# Patient Record
Sex: Female | Born: 2000 | Race: Black or African American | Hispanic: No | Marital: Single | Smoking: Never smoker
Health system: Southern US, Community
[De-identification: ages and names within clinical notes are randomized; demographics above are authoritative.]

## PROBLEM LIST (undated history)

## (undated) DIAGNOSIS — N83209 Unspecified ovarian cyst, unspecified side: Secondary | ICD-10-CM

## (undated) HISTORY — PX: CYSTECTOMY: SHX5119

## (undated) HISTORY — DX: Unspecified ovarian cyst, unspecified side: N83.209

## (undated) HISTORY — PX: NO PAST SURGERIES: SHX2092

---
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
## 2022-05-26 IMAGING — CR DG CHEST 2V
2 series · 2 of 2 positions shown · non-contrast
Comparison: None.

CLINICAL DATA: Cough and short of breath

EXAM:
CHEST - 2 VIEW

[w chest pa]
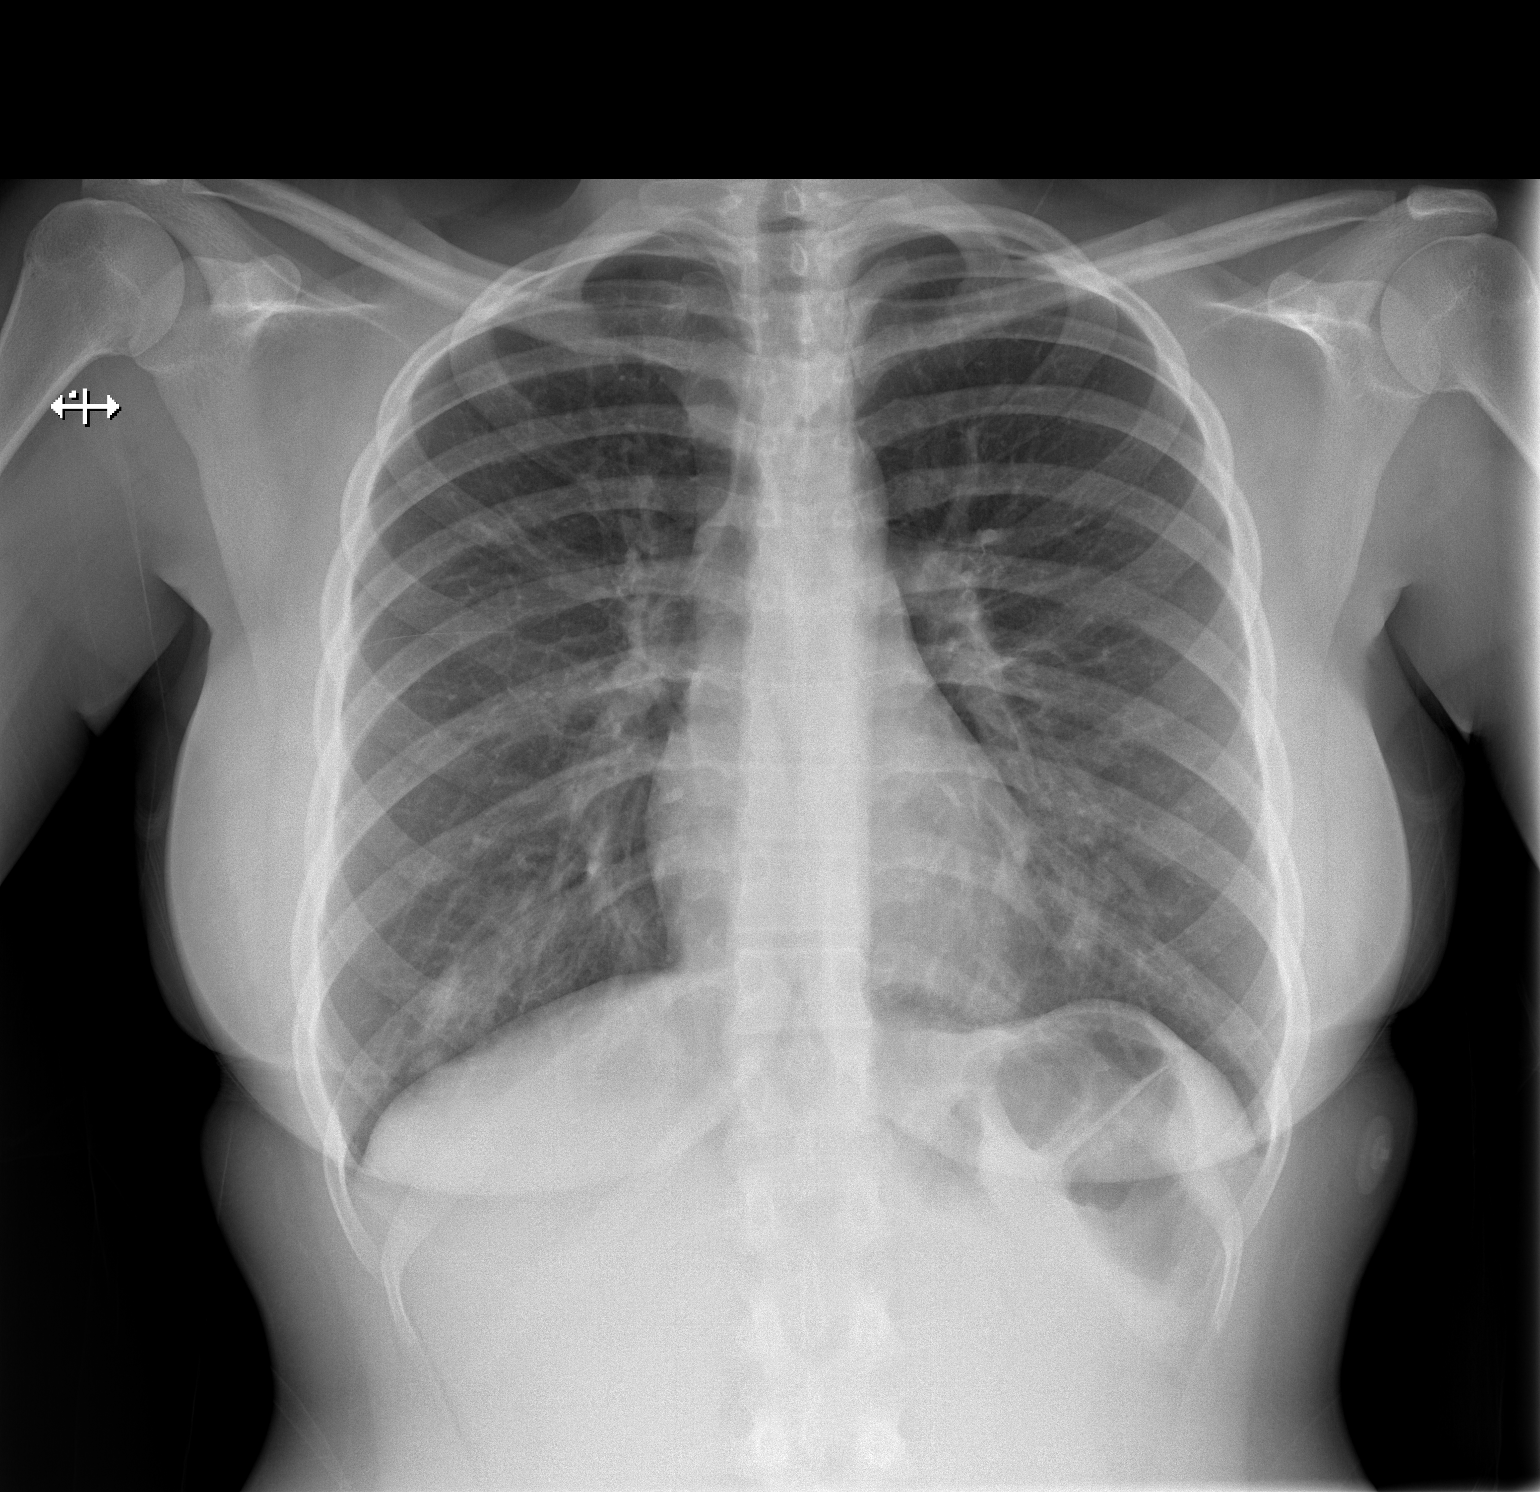

[w chest lat]
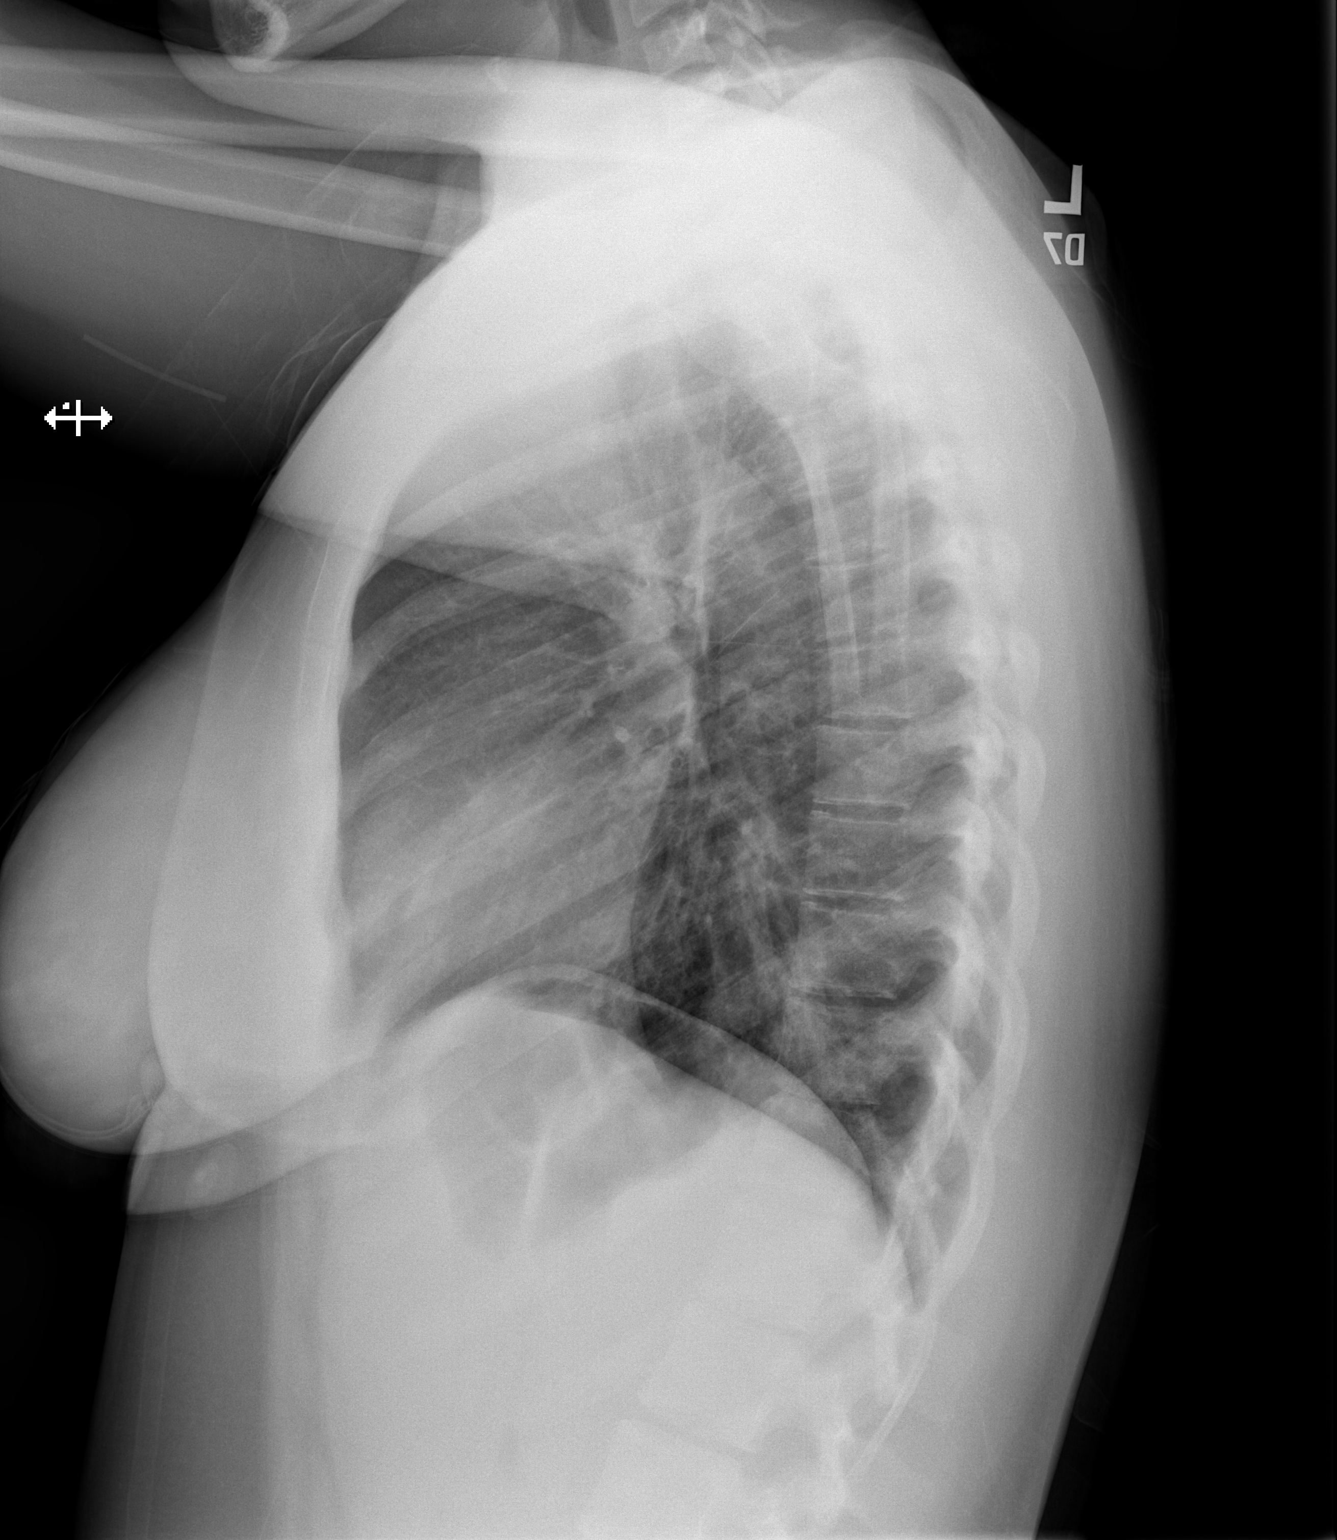

[2 of 2 positions shown; findings below may reference images not displayed]

FINDINGS: Patchy right base opacity. No pleural effusion. Normal heart size.
No pneumothorax
IMPRESSION: Patchy right base opacity suspicious for pneumonia.

## 2023-04-04 ENCOUNTER — Ambulatory Visit: Payer: Medicaid Other | Admitting: Obstetrics and Gynecology

## 2023-04-04 ENCOUNTER — Encounter: Payer: Self-pay | Admitting: Obstetrics and Gynecology

## 2023-04-04 VITALS — BP 95/62 | HR 75 | Ht 64.0 in | Wt 140.0 lb

## 2023-04-04 DIAGNOSIS — Z90721 Acquired absence of ovaries, unilateral: Secondary | ICD-10-CM

## 2023-04-04 NOTE — Progress Notes (Signed)
 Pt states she has cyst on right ovary.   Pt states she had surgery last summer - some info under media tab - TAP[facility] notes. Pt was advised to find GYN for further evaluation. Pt states she will have some pain on the left side.  Pt has Nexplanon for [facility].

## 2023-04-04 NOTE — Progress Notes (Signed)
 NEW GYNECOLOGY PATIENT Patient name: Jamie Carter MRN [id number]  Date of birth: 12-16-2000 Chief Complaint:   New Patient (Initial Visit)     History:  Naveena Eyman is a 23 y.o. G0P0000 being seen today for establishment of care. Underwent surgery to remove ovary last year for large ovarian cyst. Was told the cyst is too big and covering the ovary therefore removed. Told there was a cyst on her othre ovary and that it wasn't safe to do it at that time. No ultrasound since then. Nexplanon in place was changed out recently. Not currently sexually active. No abnormal vaginal discharge.  No pain presently      Gynecologic History No LMP recorded. Contraception: Nexplanon Last Pap: not indicated Last Mammogram: n/a Last Colonoscopy: n/a  Obstetric History OB History  Gravida Para Term Preterm AB Living  0 0 0 0 0 0  SAB IAB Ectopic Multiple Live Births  0 0 0 0 0    Past Medical History:  Diagnosis Date   Ovarian cyst     Past Surgical History:  Procedure Laterality Date   CYSTECTOMY     NO PAST SURGERIES      Current Outpatient Medications on File Prior to Visit  Medication Sig Dispense Refill   etonogestrel (NEXPLANON) 68 MG IMPL implant 1 each by Subdermal route once.     ibuprofen (ADVIL,MOTRIN) 600 MG tablet Take 600 mg by mouth every 6 (six) hours as needed.     riboflavin (VITAMIN B-2) 100 MG TABS tablet Take 1 tablet (100 mg total) by mouth daily. (Patient not taking: Reported on 06/16/2018)  0   No current facility-administered medications on file prior to visit.    Allergies  Allergen Reactions   Grass Extracts [Gramineae Pollens]     Social History:  reports that she has never smoked. She has never used smokeless tobacco. She reports current alcohol use. She reports current drug use. Drug: Marijuana.  Family History  Problem Relation Age of Onset   Sickle cell trait Mother    Migraines Neg Hx    Seizures Neg Hx     The following portions of the  patient's history were reviewed and updated as appropriate: allergies, current medications, past family history, past medical history, past social history, past surgical history and problem list.  Review of Systems Pertinent items noted in HPI and remainder of comprehensive ROS otherwise negative.  Physical Exam:  BP 95/62   Pulse 75   Ht 5' 4 (1.626 m)   Wt 140 lb (63.5 kg)   BMI 24.03 kg/m  Physical Exam Vitals and nursing note reviewed. Exam conducted with a chaperone present.  Constitutional:      Appearance: Normal appearance.  Pulmonary:     Effort: Pulmonary effort is normal.  Abdominal:     Palpations: Abdomen is soft.     Comments: Well healed laparoscopic scar   Genitourinary:    General: Normal vulva.     Exam position: Lithotomy position.  Neurological:     Mental Status: She is alert.       Assessment and Plan:   1. History of oophorectomy, unilateral (Primary) Patient to complete ROI to obtain operative report. Pelvic US  to assess for cyst on remaining ovary. Otherwise, return in a year for annual with pap smear.  - US  PELVIC COMPLETE WITH TRANSVAGINAL; Future    Routine preventative health maintenance measures emphasized. Please refer to After Visit Summary for other counseling recommendations.   Follow-up: No follow-ups on  file.      Carter Quarry, MD Obstetrician & Gynecologist, Faculty Practice [facility] for [employer], [facility]

## 2023-04-12 ENCOUNTER — Ambulatory Visit (HOSPITAL_BASED_OUTPATIENT_CLINIC_OR_DEPARTMENT_OTHER)
Admission: RE | Admit: 2023-04-12 | Discharge: 2023-04-12 | Disposition: A | Discharge status: Home / Self Care | Payer: Medicaid Other | Source: Ambulatory Visit | Attending: Obstetrics and Gynecology | Admitting: Obstetrics and Gynecology

## 2023-04-12 DIAGNOSIS — Z90721 Acquired absence of ovaries, unilateral: Secondary | ICD-10-CM | POA: Diagnosis present

## 2023-04-15 ENCOUNTER — Encounter: Payer: Self-pay | Admitting: Obstetrics and Gynecology

## 2023-04-15 ENCOUNTER — Other Ambulatory Visit: Payer: Self-pay | Admitting: Obstetrics and Gynecology

## 2023-04-15 DIAGNOSIS — N83201 Unspecified ovarian cyst, right side: Secondary | ICD-10-CM

## 2023-05-15 ENCOUNTER — Encounter: Payer: Self-pay | Admitting: Obstetrics and Gynecology

## 2023-05-18 ENCOUNTER — Ambulatory Visit
Admission: RE | Admit: 2023-05-18 | Discharge: 2023-05-18 | Disposition: A | Discharge status: Home / Self Care | Payer: Medicaid Other | Source: Ambulatory Visit | Attending: Obstetrics and Gynecology

## 2023-05-18 DIAGNOSIS — N83201 Unspecified ovarian cyst, right side: Secondary | ICD-10-CM

## 2023-05-18 MED ORDER — GADOPICLENOL 0.5 MMOL/ML IV SOLN
6.0000 mL | Freq: Once | INTRAVENOUS | Status: AC | PRN
  Administered 2023-05-18: 6 mL via INTRAVENOUS

## 2023-05-22 ENCOUNTER — Encounter: Payer: Self-pay | Admitting: Obstetrics and Gynecology

## 2023-05-26 ENCOUNTER — Telehealth: Admitting: Nurse Practitioner

## 2023-05-26 DIAGNOSIS — B3731 Acute candidiasis of vulva and vagina: Secondary | ICD-10-CM

## 2023-05-26 MED ORDER — FLUCONAZOLE 150 MG PO TABS: 150.0000 mg | ORAL_TABLET | Freq: Every day | ORAL | 0 refills | Status: DC

## 2023-05-26 NOTE — Progress Notes (Signed)
 I have spent 5 minutes in review of e-visit questionnaire, review and updating patient chart, medical decision making and response to patient.   Claiborne Rigg, NP

## 2023-05-26 NOTE — Progress Notes (Signed)

## 2023-06-05 ENCOUNTER — Encounter: Payer: Self-pay | Admitting: Obstetrics and Gynecology

## 2023-06-05 ENCOUNTER — Ambulatory Visit: Admitting: Obstetrics and Gynecology

## 2023-06-05 VITALS — BP 106/64 | HR 62 | Ht 64.0 in | Wt 123.0 lb

## 2023-06-05 DIAGNOSIS — Z90721 Acquired absence of ovaries, unilateral: Secondary | ICD-10-CM

## 2023-06-05 DIAGNOSIS — N83201 Unspecified ovarian cyst, right side: Secondary | ICD-10-CM

## 2023-06-05 DIAGNOSIS — Z113 Encounter for screening for infections with a predominantly sexual mode of transmission: Secondary | ICD-10-CM

## 2023-06-05 NOTE — Progress Notes (Signed)
 Neplanon reinserted 12/2022. Asking for STI testing today. Only needs blood work. Has current therapist. Has white bump on vulva. Wants checked.

## 2023-06-05 NOTE — Progress Notes (Signed)
 GYNECOLOGY VISIT  Patient name: Jamie Carter MRN [id number]  Date of birth: 10/15/2000 Chief Complaint:   Follow-up  History:  Jamie Carter is a 23 y.o. G0P0000 being seen today for follow up regarding ovarian cyst; also requesting STI testing today .    New Gyn (04/04/23): hx of unilateral oophorectomy for enlargedo varian cyst, nexplanon in place for contraception. No pain. [place] ordered to assess remaining ovary as told previously that there was an ovary present.   Discussed the use of AI scribe software for clinical note transcription with the patient, who gave verbal consent to proceed.  History of Present Illness Jamie Carter is a 23 year old female with a history of ovarian cysts who presents with pain and concerns about a new ovarian cyst.  She has a history of ovarian cysts and previously underwent surgery to remove one ovary due to a large cyst. She now presents with a new large dermoid cyst on her remaining ovary, measuring nearly eight centimeters. She experiences sharp, stabbing pain on her side, which is a new symptom as she previously did not have pain.  She noticed a small white bump on her vulva after experiencing discomfort that she initially thought might be a yeast infection. She took a yeast infection pill through an online service, which alleviated her irritation. The bump does not cause pain, and she is concerned about the possibility of it being herpes.  She has requested serum STI tests, including HIV and syphilis, during this visit.   Past Medical History:  Diagnosis Date   Ovarian cyst     Past Surgical History:  Procedure Laterality Date   CYSTECTOMY     NO PAST SURGERIES      The following portions of the patient's history were reviewed and updated as appropriate: allergies, current medications, past family history, past medical history, past social history, past surgical history and problem list.   Health Maintenance:   Last pap n/a Last  mammogram: n/a   Review of Systems:  Pertinent items are noted in HPI. Comprehensive review of systems was otherwise negative.   Objective:  Physical Exam BP 106/64   Pulse 62   Ht 5\' 4"  (1.626 m)   Wt 123 lb (55.8 kg)   LMP 05/23/2023   BMI 21.11 kg/m    Physical Exam Vitals and nursing note reviewed. Exam conducted with a chaperone present.  Constitutional:      Appearance: Normal appearance.  HENT:     Head: Normocephalic and atraumatic.  Pulmonary:     Effort: Pulmonary effort is normal.     Breath sounds: Normal breath sounds.  Genitourinary:    General: Normal vulva.     Exam position: Lithotomy position.     Vagina: Normal.     Cervix: Normal.     Comments: 1mm white bump on upper inner labia minora, nontender, without surrounding erythema, no fluctance Skin:    General: Skin is warm and dry.  Neurological:     General: No focal deficit present.     Mental Status: She is alert.  Psychiatric:        Mood and Affect: Mood normal.        Behavior: Behavior normal.        Thought Content: Thought content normal.        Judgment: Judgment normal.      Labs and Imaging MR PELVIS W WO CONTRAST Result Date: 05/19/2023 CLINICAL DATA:  Pelvic pain. Right ovarian mass on recent  ultrasound. EXAM: MRI PELVIS WITHOUT AND WITH CONTRAST TECHNIQUE: Multiplanar multisequence MR imaging of the pelvis was performed both before and after administration of intravenous contrast. CONTRAST:  6 mL Vueway COMPARISON:  Ultrasound on 04/12/2023 FINDINGS: Lower Urinary Tract: No urinary bladder or urethral abnormality identified. Bowel: Unremarkable pelvic bowel loops. Vascular/Lymphatic: Unremarkable. No pathologically enlarged pelvic lymph nodes identified. Reproductive: -- Uterus: Measures 8.0 by 3.3 by 3.9 cm. No myometrial masses or focal endometrial lesions identified. Cervix and vagina are unremarkable. -- Right ovary: A cystic and solid mass is seen in the right adnexa which measures  8.4 x 5.0 x 5.3 cm. This contains a large amount of internal fat and lack of solid enhancing component, consistent with a benign ovarian dermoid. -- Left ovary:  Not visualized, however no adnexal mass identified. Other: No peritoneal thickening or abnormal free fluid. Musculoskeletal:  Unremarkable. 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 Nonvisualization of the left ovary. No evidence of left adnexal mass. Normal appearance of uterus. Electronically Signed   By: Danae Orleans M.D.   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 Discussed watchful waiting vs. surgical removal. She concerned about ovary loss and egg retrieval given history of prior oophorectomy for dermoid cyst - Refer to reproductive endocrinologist for consultation on egg retrieval and cyst removal options.  Vulvar bump Small white vulvar bump, noticed after treating vaginal candida infection - Exam consistent with possible inclusion cyst, does not appear herpetic  Sexually transmitted infection screening Requested serum STI tests for health maintenance. - Order serum STI tests including HIV and syphilis.  Routine preventative health maintenance measures emphasized.  Lorriane Shire, MD [facility] for [facility], [facility]

## 2023-06-06 ENCOUNTER — Encounter: Payer: Self-pay | Admitting: Obstetrics and Gynecology

## 2023-06-06 LAB — RPR+HBSAG+HCVAB+...
HIV Screen 4th Generation wRfx: NONREACTIVE
Hep C Virus Ab: NONREACTIVE
Hepatitis B Surface Ag: NEGATIVE
RPR Ser Ql: NONREACTIVE

## 2023-12-12 ENCOUNTER — Telehealth: Admitting: Physician Assistant

## 2023-12-12 DIAGNOSIS — A084 Viral intestinal infection, unspecified: Secondary | ICD-10-CM | POA: Diagnosis not present

## 2023-12-13 MED ORDER — ONDANSETRON 4 MG PO TBDP: 4.0000 mg | ORAL_TABLET | Freq: Three times a day (TID) | ORAL | 0 refills | Status: DC | PRN

## 2023-12-13 NOTE — Progress Notes (Signed)

## 2024-01-20 ENCOUNTER — Telehealth: Payer: Self-pay

## 2024-01-20 NOTE — Progress Notes (Deleted)
    GYNECOLOGY VISIT  Patient name: Jamie Carter MRN [id number]  Date of birth: 2000-07-17 Chief Complaint:   No chief complaint on file.   History:  Discussed the use of AI scribe software for clinical note transcription with the patient, who gave verbal consent to proceed.  History of Present Illness   Chart reivew: hx of oophorectomy for dermoid with new dermoid on contralateral ovary. Sent to [facility] to review egg retrieval options   The following portions of the patient's history were reviewed and updated as appropriate: allergies, current medications, past family history, past medical history, past social history, past surgical history and problem list.   Health Maintenance:   Last pap No results found for: DIAGPAP, HPVHIGH, ADEQPAP  Health Maintenance  Topic Date Due   Chlamydia screening  Never done   HPV Vaccine (1 - 3-dose series) Never done   Meningitis B Vaccine (1 of 2 - Standard) Never done   DTaP/Tdap/Td vaccine (1 - Tdap) Never done   Hepatitis B Vaccine (1 of 3 - 19+ 3-dose series) Never done   Flu Shot  Never done   COVID-19 Vaccine (1 - 2025-26 season) Never done   Pap Smear  Never done   Hepatitis C Screening  Completed   HIV Screening  Completed   Pneumococcal Vaccine  Aged Out      Review of Systems:  {Ros - complete:30496} Comprehensive review of systems was otherwise negative.   Objective:  Physical Exam There were no vitals taken for this visit.   Physical Exam   Labs and Imaging No results found. 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   Nonvisualization of the left ovary. No evidence of left adnexal mass.      Assessment & Plan:  Assessment and Plan Assessment & Plan        *** Routine preventative health maintenance measures emphasized.  Carter Quarry, MD Minimally Invasive Gynecologic [facility] for [facility], [facility]

## 2024-01-20 NOTE — Telephone Encounter (Signed)
 Left Message - LVMM to call Office, Pt to come in earlier @3 :30 pm on Wednesday.

## 2024-01-22 ENCOUNTER — Ambulatory Visit: Admitting: Obstetrics and Gynecology

## 2024-02-07 ENCOUNTER — Ambulatory Visit: Admitting: Obstetrics and Gynecology

## 2024-03-18 ENCOUNTER — Telehealth: Admitting: Physician Assistant

## 2024-03-18 ENCOUNTER — Other Ambulatory Visit: Payer: Self-pay

## 2024-03-18 ENCOUNTER — Emergency Department (HOSPITAL_COMMUNITY)

## 2024-03-18 ENCOUNTER — Emergency Department (HOSPITAL_COMMUNITY)
Admission: EM | Admit: 2024-03-18 | Discharge: 2024-03-18 | Disposition: A | Discharge status: Home / Self Care | Attending: Emergency Medicine | Admitting: Emergency Medicine

## 2024-03-18 DIAGNOSIS — N939 Abnormal uterine and vaginal bleeding, unspecified: Secondary | ICD-10-CM | POA: Insufficient documentation

## 2024-03-18 DIAGNOSIS — N946 Dysmenorrhea, unspecified: Secondary | ICD-10-CM

## 2024-03-18 DIAGNOSIS — N838 Other noninflammatory disorders of ovary, fallopian tube and broad ligament: Secondary | ICD-10-CM | POA: Diagnosis not present

## 2024-03-18 LAB — CBC WITH DIFFERENTIAL/PLATELET
Abs Immature Granulocytes: 0.01 K/uL (ref 0.00–0.07)
Basophils Absolute: 0 K/uL (ref 0.0–0.1)
Basophils Relative: 0 %
Eosinophils Absolute: 0.2 K/uL (ref 0.0–0.5)
Eosinophils Relative: 4 %
HCT: 47.3 % — ABNORMAL HIGH (ref 36.0–46.0)
Hemoglobin: 15.9 g/dL — ABNORMAL HIGH (ref 12.0–15.0)
Immature Granulocytes: 0 %
Lymphocytes Relative: 35 %
Lymphs Abs: 1.9 K/uL (ref 0.7–4.0)
MCH: 29.7 pg (ref 26.0–34.0)
MCHC: 33.6 g/dL (ref 30.0–36.0)
MCV: 88.4 fL (ref 80.0–100.0)
Monocytes Absolute: 0.2 K/uL (ref 0.1–1.0)
Monocytes Relative: 4 %
Neutro Abs: 3.1 K/uL (ref 1.7–7.7)
Neutrophils Relative %: 57 %
Platelets: 237 K/uL (ref 150–400)
RBC: 5.35 MIL/uL — ABNORMAL HIGH (ref 3.87–5.11)
RDW: 13.2 % (ref 11.5–15.5)
WBC: 5.5 K/uL (ref 4.0–10.5)
nRBC: 0 % (ref 0.0–0.2)

## 2024-03-18 LAB — URINALYSIS, ROUTINE W REFLEX MICROSCOPIC
Bilirubin Urine: NEGATIVE
Glucose, UA: NEGATIVE mg/dL
Hgb urine dipstick: NEGATIVE
Ketones, ur: NEGATIVE mg/dL
Leukocytes,Ua: NEGATIVE
Nitrite: NEGATIVE
Protein, ur: NEGATIVE mg/dL
Specific Gravity, Urine: 1.018 (ref 1.005–1.030)
pH: 8 (ref 5.0–8.0)

## 2024-03-18 LAB — BASIC METABOLIC PANEL WITH GFR
Anion gap: 9 (ref 5–15)
BUN: 11 mg/dL (ref 6–20)
CO2: 27 mmol/L (ref 22–32)
Calcium: 9.5 mg/dL (ref 8.9–10.3)
Chloride: 104 mmol/L (ref 98–111)
Creatinine, Ser: 0.79 mg/dL (ref 0.44–1.00)
GFR, Estimated: >60 mL/min
Glucose, Bld: 100 mg/dL — ABNORMAL HIGH (ref 70–99)
Potassium: 4.1 mmol/L (ref 3.5–5.1)
Sodium: 140 mmol/L (ref 135–145)

## 2024-03-18 LAB — SYPHILIS: RPR W/REFLEX TO RPR TITER AND TREPONEMAL ANTIBODIES, TRADITIONAL SCREENING AND DIAGNOSIS ALGORITHM: RPR Ser Ql: NONREACTIVE

## 2024-03-18 LAB — WET PREP, GENITAL
Clue Cells Wet Prep HPF POC: NONE SEEN
Sperm: NONE SEEN
Trich, Wet Prep: NONE SEEN
WBC, Wet Prep HPF POC: <10
Yeast Wet Prep HPF POC: NONE SEEN

## 2024-03-18 LAB — HIV ANTIBODY (ROUTINE TESTING W REFLEX): HIV Screen 4th Generation wRfx: NONREACTIVE

## 2024-03-18 LAB — HCG, SERUM, QUALITATIVE: Preg, Serum: NEGATIVE

## 2024-03-18 LAB — LIPASE, BLOOD: Lipase: 40 U/L (ref 11–51)

## 2024-03-18 NOTE — Discharge Instructions (Signed)
 The pelvic ultrasound that was done today does show a right sided ovarian mass, however this is consistent with your prior diagnosis of an ovarian cyst on the same side.  Please contact your OB/GYN to schedule a follow-up visit in regard to your abnormal vaginal bleeding and to discuss options regarding your right ovarian cyst. The results of your labs that were not available at the time of your discharge today should be available in the next 24 to 48 hours on MyChart. Return to the emergency department if your symptoms worsen.

## 2024-03-18 NOTE — Progress Notes (Signed)
" °  Because of excessive and prolonged menstrual bleeding with symptoms concerning for anemia, I feel your condition warrants further evaluation and I recommend that you be seen in a face-to-face visit.   NOTE: There will be NO CHARGE for this E-Visit   If you are having a true medical emergency, please call 911.     For an urgent face to face visit, [place] has multiple urgent care centers for your convenience.  Click the link below for the full list of locations and hours, walk-in wait times, appointment scheduling options and driving directions:  Urgent Care - [place], [place], [place], [place], [place], [place]  [place]     Your MyChart E-visit questionnaire answers were reviewed by a board certified advanced clinical practitioner to complete your personal care plan based on your specific symptoms.    Thank you for using e-Visits.    "

## 2024-03-18 NOTE — ED Provider Notes (Signed)
 " [place] [facility] Provider Note   CSN: [id number] Arrival date & time: 03/18/24  9275     Patient presents with: No chief complaint on file.   Jamie Carter is a 24 y.o. female.   24 year old female presenting with multiple complaints.  Patient notes ongoing vaginal bleeding since 12/22, has history of irregular menstrual cycles in the past however states that it has never lasted this long, bleeding has been heavy at times but is currently now more like spotting, denies passage of large clots.  Notes intermittent right lower quadrant abdominal pain, has been told that she has an ovarian cyst the size of a grapefruit, history of left oophorectomy last year seemingly for concern of ovarian torsion.  Patient notes that the pain is not constant but at times when it is at its worst it is a stabbing pain and causes nausea/vomiting as a result of the discomfort.  Patient reports that she has pain in her right lower quadrant when having intercourse, therefore she has avoided this for over a month.  She denies any urinary symptoms, denies change in vaginal discharge/odor.  She would like to be tested for STIs as well.  Is followed by OB/GYN but has not been seen by their office in 3 months or so.       Prior to Admission medications  Medication Sig Start Date End Date Taking? Authorizing Provider  ibuprofen (ADVIL,MOTRIN) 600 MG tablet Take 600 mg by mouth every 6 (six) hours as needed for mild pain (pain score 1-3).   Yes [provider]    Allergies: Grass extracts [gramineae pollens]    Review of Systems  Updated Vital Signs  Vitals:   03/18/24 0737  Temp: 99.3 F (37.4 C)  TempSrc: Oral  Weight: 63.5 kg  Height: 5' 4 (1.626 m)     Physical Exam Vitals and nursing note reviewed.  Constitutional:      General: She is not in acute distress.    Appearance: Normal appearance. She is not ill-appearing or toxic-appearing.  HENT:      Head: Normocephalic and atraumatic.     Mouth/Throat:     Mouth: Mucous membranes are moist.  Eyes:     Extraocular Movements: Extraocular movements intact.     Pupils: Pupils are equal, round, and reactive to light.  Cardiovascular:     Rate and Rhythm: Normal rate and regular rhythm.     Heart sounds: Normal heart sounds.  Pulmonary:     Effort: Pulmonary effort is normal.     Breath sounds: Normal breath sounds.  Abdominal:     Palpations: Abdomen is soft.     Tenderness: There is no abdominal tenderness. There is no guarding.  Genitourinary:    General: Normal vulva.     Labia:        Right: No rash, tenderness or lesion.        Left: No rash, tenderness or lesion.      Cervix: Normal. No cervical motion tenderness or friability.     Comments: Pelvic exam performed with nurse Mabel at the bedside as chaperone  White/green tinged discharge noted in the vaginal vault. No active bleeding, no retained clots.  Musculoskeletal:     Cervical back: Normal range of motion.     Comments: Moves all extremities spontaneously without difficulty  Skin:    General: Skin is warm and dry.  Neurological:     General: No focal deficit present.  Mental Status: She is alert and oriented to person, place, and time.     (all labs ordered are listed, but only abnormal results are displayed) Labs Reviewed  CBC WITH DIFFERENTIAL/PLATELET - Abnormal; Notable for the following components:      Result Value   RBC 5.35 (*)    Hemoglobin 15.9 (*)    HCT 47.3 (*)    All other components within normal limits  BASIC METABOLIC PANEL WITH GFR - Abnormal; Notable for the following components:   Glucose, Bld 100 (*)    All other components within normal limits  URINALYSIS, ROUTINE W REFLEX MICROSCOPIC - Abnormal; Notable for the following components:   APPearance HAZY (*)    All other components within normal limits  WET PREP, GENITAL  LIPASE, BLOOD  HCG, SERUM, QUALITATIVE  HIV ANTIBODY  (ROUTINE TESTING W REFLEX)  SYPHILIS: RPR W/REFLEX TO RPR TITER AND TREPONEMAL ANTIBODIES, TRADITIONAL SCREENING AND DIAGNOSIS ALGORITHM  GC/CHLAMYDIA PROBE AMP ([place]) NOT AT [facility]    EKG: None  Radiology: US  Pelvis Complete Result Date: 03/18/2024 CLINICAL DATA:  Right lower quadrant pain. EXAM: TRANSABDOMINAL AND TRANSVAGINAL ULTRASOUND OF PELVIS DOPPLER ULTRASOUND OF OVARIES TECHNIQUE: Both transabdominal and transvaginal ultrasound examinations of the pelvis were performed. Transabdominal technique was performed for global imaging of the pelvis including uterus, ovaries, adnexal regions, and pelvic cul-de-sac. It was necessary to proceed with endovaginal exam following the transabdominal exam to visualize the uterus, endometrium, ovaries and adnexal regions. Color and duplex Doppler ultrasound was utilized to evaluate blood flow to the ovaries. COMPARISON:  MR pelvis 05/16/2023, pelvic ultrasound 04/12/2023. FINDINGS: Uterus Measurements: 6.6 x 3.1 x 3.9 cm = volume: 41.2 mL. No fibroids or other mass visualized. Endometrium Thickness: 1.5 mm, within normal limits for age. No focal abnormality visualized. Right ovary Measurements: 7.8 x 3.5 x 5.7 cm = volume:  81.6 mL. Solid and cystic mass, as on prior exams. The solid component measures 5.6 x 4.0 x 6.6 cm with the cystic component measuring 3.1 x 2.8 x 2.3 cm. Doppler: There is normal vascularity on color doppler examination. Spectral doppler arterial and venous waveforms are normal. Left ovary Surgically absent. Other findings Trace free fluid. IMPRESSION: 1. Solid and cystic right ovarian mass, characterized as a benign dermoid on 05/18/2023. 2. Left oophorectomy. Electronically Signed   By: Newell Eke M.D.   On: 03/18/2024 10:50   US  Transvaginal Non-OB Result Date: 03/18/2024 CLINICAL DATA:  Right lower quadrant pain. EXAM: TRANSABDOMINAL AND TRANSVAGINAL ULTRASOUND OF PELVIS DOPPLER ULTRASOUND OF OVARIES TECHNIQUE: Both  transabdominal and transvaginal ultrasound examinations of the pelvis were performed. Transabdominal technique was performed for global imaging of the pelvis including uterus, ovaries, adnexal regions, and pelvic cul-de-sac. It was necessary to proceed with endovaginal exam following the transabdominal exam to visualize the uterus, endometrium, ovaries and adnexal regions. Color and duplex Doppler ultrasound was utilized to evaluate blood flow to the ovaries. COMPARISON:  MR pelvis 05/16/2023, pelvic ultrasound 04/12/2023. FINDINGS: Uterus Measurements: 6.6 x 3.1 x 3.9 cm = volume: 41.2 mL. No fibroids or other mass visualized. Endometrium Thickness: 1.5 mm, within normal limits for age. No focal abnormality visualized. Right ovary Measurements: 7.8 x 3.5 x 5.7 cm = volume:  81.6 mL. Solid and cystic mass, as on prior exams. The solid component measures 5.6 x 4.0 x 6.6 cm with the cystic component measuring 3.1 x 2.8 x 2.3 cm. Doppler: There is normal vascularity on color doppler examination. Spectral doppler arterial and venous waveforms are normal.  Left ovary Surgically absent. Other findings Trace free fluid. IMPRESSION: 1. Solid and cystic right ovarian mass, characterized as a benign dermoid on 05/18/2023. 2. Left oophorectomy. Electronically Signed   By: Newell Eke M.D.   On: 03/18/2024 10:50   US  Art/Ven Flow Abd Pelv Doppler Result Date: 03/18/2024 CLINICAL DATA:  Right lower quadrant pain. EXAM: TRANSABDOMINAL AND TRANSVAGINAL ULTRASOUND OF PELVIS DOPPLER ULTRASOUND OF OVARIES TECHNIQUE: Both transabdominal and transvaginal ultrasound examinations of the pelvis were performed. Transabdominal technique was performed for global imaging of the pelvis including uterus, ovaries, adnexal regions, and pelvic cul-de-sac. It was necessary to proceed with endovaginal exam following the transabdominal exam to visualize the uterus, endometrium, ovaries and adnexal regions. Color and duplex Doppler ultrasound  was utilized to evaluate blood flow to the ovaries. COMPARISON:  MR pelvis 05/16/2023, pelvic ultrasound 04/12/2023. FINDINGS: Uterus Measurements: 6.6 x 3.1 x 3.9 cm = volume: 41.2 mL. No fibroids or other mass visualized. Endometrium Thickness: 1.5 mm, within normal limits for age. No focal abnormality visualized. Right ovary Measurements: 7.8 x 3.5 x 5.7 cm = volume:  81.6 mL. Solid and cystic mass, as on prior exams. The solid component measures 5.6 x 4.0 x 6.6 cm with the cystic component measuring 3.1 x 2.8 x 2.3 cm. Doppler: There is normal vascularity on color doppler examination. Spectral doppler arterial and venous waveforms are normal. Left ovary Surgically absent. Other findings Trace free fluid. IMPRESSION: 1. Solid and cystic right ovarian mass, characterized as a benign dermoid on 05/18/2023. 2. Left oophorectomy. Electronically Signed   By: Newell Eke M.D.   On: 03/18/2024 10:50     Procedures   Medications Ordered in the ED - No data to display                                  Medical Decision Making This patient presents to the ED for concern of intermittent abdominal pain with irregular vaginal bleeding, this involves an extensive number of treatment options, and is a complaint that carries with it a high risk of complications and morbidity.  The differential diagnosis includes ovarian cyst, ovarian torsion, abnormal uterine bleeding secondary to Nexplanon, abnormal uterine bleeding secondary to other causes including but not limited to adenomyosis/fibroids/hormonal cause, gonorrhea/chlamydia/syphilis/HIV/trichomonas/yeast infection/bacterial vaginosis, anemia requiring blood transfusion   Co morbidities that complicate the patient evaluation  History of left oophorectomy   Additional history obtained:  Additional history obtained from record review External records from outside source obtained and reviewed including prior OBGYN note   Lab Tests:  I Ordered, and  personally interpreted labs.  The pertinent results include:  CBC notable for hemoglobin of 15.9.  BMP unremarkable.  Lipase within normal limits, 40.  Serum hCG negative.  Urinalysis unremarkable.  Wet prep normal.  Gonorrhea/chlamydia probe, HIV, RPR pending at discharge.    Imaging Studies ordered:  I ordered imaging studies including TVUS  I independently visualized and interpreted imaging which showed 1. Solid and cystic right ovarian mass, characterized as a benign dermoid on 05/18/2023. 2. Left oophorectomy.  I agree with the radiologist interpretation   Cardiac Monitoring: / EKG:  The patient was maintained on a cardiac monitor.  I personally viewed and interpreted the cardiac monitored which showed an underlying rhythm of: NSR  Problem List / ED Course / Critical interventions / Medication management I have reviewed the patients home medicines and have made adjustments as needed   Test /  Admission - Considered:  Physical exam is largely unremarkable as above, patient's abdomen is soft and nontender on exam today however she does complain of intermittent right lower quadrant pain in the region of her ovary, history of grapefruit size right ovarian cyst in that region raises suspicion for intermittent ovarian torsion symptoms.  Pelvic ultrasound obtained, does demonstrate ovarian mass/cyst that has been characterized on imaging previously, no torsion. Wet prep and gonorrhea/chlamydia probe obtained, no vaginal bleeding presently, patient has had ongoing vaginal bleeding for 1 month but it appears to be tapering off at this point.  Patient is already well-established with an OB/GYN, I recommend she contact their office to discuss her ongoing symptoms.  Patient understands that results of gonorrhea/chlamydia probe as well as RPR and HIV testing will be available on her MyChart likely in the next 24 to 48 hours.  Return precautions discussed, patient voiced understanding and is in  agreement this plan, she is appropriate for discharge at this time.     Amount and/or Complexity of Data Reviewed Labs: ordered. Radiology: ordered.        Final diagnoses:  Ovarian mass, right  Vaginal bleeding    ED Discharge Orders     None          Glendia Rocky SAILOR, PA-C 03/18/24 1134    Ruthe Cornet, DO 03/18/24 1218  "

## 2024-03-18 NOTE — ED Triage Notes (Signed)
 Pt. Stated, Jamie Carter had vaginal bleeding off and on for a month. Sometimes I have stomach pain with Nausea. I have cyst on my ovary.

## 2024-03-18 NOTE — ED Notes (Signed)
 Patient transported to Ultrasound

## 2024-03-19 LAB — GC/CHLAMYDIA PROBE AMP (~~LOC~~) NOT AT ARMC
Chlamydia: NEGATIVE
Comment: NEGATIVE
Comment: NORMAL
Neisseria Gonorrhea: NEGATIVE

## 2024-04-06 ENCOUNTER — Ambulatory Visit: Admitting: Obstetrics

## 2024-04-21 ENCOUNTER — Ambulatory Visit: Admitting: Obstetrics and Gynecology
